# Patient Record
Sex: Male | Born: 1950 | Race: White | Hispanic: No | Marital: Married | Smoking: Former smoker
Health system: Southern US, Community
[De-identification: ages and names within clinical notes are randomized; demographics above are authoritative.]

## PROBLEM LIST (undated history)

## (undated) DIAGNOSIS — I639 Cerebral infarction, unspecified: Secondary | ICD-10-CM

## (undated) DIAGNOSIS — E785 Hyperlipidemia, unspecified: Secondary | ICD-10-CM

## (undated) HISTORY — DX: Cerebral infarction, unspecified: I63.9

## (undated) HISTORY — DX: Hyperlipidemia, unspecified: E78.5

---

## 1977-10-07 HISTORY — PX: BACK SURGERY: SHX140

## 2009-07-07 HISTORY — PX: SHOULDER SURGERY: SHX246

## 2009-07-14 ENCOUNTER — Encounter: Admission: RE | Admit: 2009-07-14 | Discharge: 2009-07-14 | Payer: Self-pay | Admitting: Orthopedic Surgery

## 2009-07-16 ENCOUNTER — Encounter: Admission: RE | Admit: 2009-07-16 | Discharge: 2009-07-16 | Payer: Self-pay | Admitting: Orthopedic Surgery

## 2009-07-26 ENCOUNTER — Ambulatory Visit (HOSPITAL_COMMUNITY): Admission: RE | Admit: 2009-07-26 | Discharge: 2009-07-27 | Payer: Self-pay | Admitting: Orthopedic Surgery

## 2009-10-27 ENCOUNTER — Encounter (INDEPENDENT_AMBULATORY_CARE_PROVIDER_SITE_OTHER): Payer: Self-pay | Admitting: Neurology

## 2009-10-27 ENCOUNTER — Inpatient Hospital Stay (HOSPITAL_COMMUNITY): Admission: EM | Admit: 2009-10-27 | Discharge: 2009-10-29 | Payer: Self-pay | Admitting: Emergency Medicine

## 2009-10-27 ENCOUNTER — Ambulatory Visit: Payer: Self-pay | Admitting: Cardiovascular Disease

## 2009-10-27 ENCOUNTER — Encounter (INDEPENDENT_AMBULATORY_CARE_PROVIDER_SITE_OTHER): Payer: Self-pay | Admitting: Emergency Medicine

## 2009-10-31 ENCOUNTER — Encounter: Admission: RE | Admit: 2009-10-31 | Discharge: 2009-11-28 | Payer: Self-pay | Admitting: Neurology

## 2009-12-25 ENCOUNTER — Encounter: Payer: Self-pay | Admitting: Internal Medicine

## 2010-01-15 ENCOUNTER — Ambulatory Visit: Payer: Self-pay

## 2010-01-16 ENCOUNTER — Encounter: Payer: Self-pay | Admitting: Internal Medicine

## 2010-01-16 ENCOUNTER — Telehealth: Payer: Self-pay | Admitting: Internal Medicine

## 2010-01-19 ENCOUNTER — Ambulatory Visit (HOSPITAL_COMMUNITY): Admission: RE | Admit: 2010-01-19 | Discharge: 2010-01-19 | Payer: Self-pay | Admitting: Internal Medicine

## 2010-01-19 ENCOUNTER — Ambulatory Visit: Payer: Self-pay | Admitting: Internal Medicine

## 2010-02-19 ENCOUNTER — Encounter: Payer: Self-pay | Admitting: Internal Medicine

## 2010-06-07 ENCOUNTER — Ambulatory Visit: Payer: Self-pay | Admitting: Cardiovascular Disease

## 2010-06-07 DIAGNOSIS — Q211 Atrial septal defect: Secondary | ICD-10-CM | POA: Insufficient documentation

## 2010-06-13 ENCOUNTER — Ambulatory Visit: Payer: Self-pay | Admitting: Cardiovascular Disease

## 2010-06-13 ENCOUNTER — Inpatient Hospital Stay (HOSPITAL_COMMUNITY): Admission: RE | Admit: 2010-06-13 | Discharge: 2010-06-14 | Payer: Self-pay | Admitting: Cardiovascular Disease

## 2010-06-13 ENCOUNTER — Ambulatory Visit: Payer: Self-pay | Admitting: Cardiology

## 2010-06-14 ENCOUNTER — Encounter: Payer: Self-pay | Admitting: Cardiovascular Disease

## 2010-07-20 ENCOUNTER — Telehealth: Payer: Self-pay | Admitting: Cardiovascular Disease

## 2010-07-27 ENCOUNTER — Encounter: Payer: Self-pay | Admitting: Cardiovascular Disease

## 2010-08-09 ENCOUNTER — Encounter: Payer: Self-pay | Admitting: Cardiovascular Disease

## 2010-08-09 ENCOUNTER — Ambulatory Visit: Payer: Self-pay

## 2010-08-09 DIAGNOSIS — I739 Peripheral vascular disease, unspecified: Secondary | ICD-10-CM | POA: Insufficient documentation

## 2010-08-24 ENCOUNTER — Encounter: Payer: Self-pay | Admitting: Cardiovascular Disease

## 2010-11-06 NOTE — Progress Notes (Signed)
Summary: TEE  Phone Note Outgoing Call   Summary of Call: Called and spoke with pt's wife regarding TEE.  TEE scheduled for January 19, 2010 at 12:15 with Dr. Tenny Craw. Instructions reviewed and wife verbalized understanding of instructions. Will fax instructions to [phone] per wife's request. Initial call taken by: Dossie Arbour, RN, BSN,  January 16, 2010 10:15 AM

## 2010-11-06 NOTE — Letter (Signed)
Summary: [facility]  [facility]   Imported By: Marylou Mccoy 09/04/2010 10:20:23  _____________________________________________________________________  External Attachment:    Type:   Image     Comment:   External Document

## 2010-11-06 NOTE — Letter (Signed)
Summary: [facility]   Imported By: Marylou Mccoy 09/07/2010 16:01:59  _____________________________________________________________________  External Attachment:    Type:   Image     Comment:   External Document

## 2010-11-06 NOTE — Assessment & Plan Note (Signed)
Summary: pfo closure   Visit Type:  Follow-up Primary Bradley Newton:  Bradley Newton, M.D.  CC:  PFO closure.  History of Present Illness: 60 year-old gentleman presents for evaluation of transcatheter PFO closure as part of the Respect PFO trial. He sustained a TIA in . 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 He has had no further neurologic symptoms and specifically denies weakness, clumsiness, tingling, numbness, visual disturbance, or aphasia.  He also denies chest pain, dyspnea, edema, orthopnea, or PND.  Current Medications (verified): 1)  Lipitor 20 Mg Tabs (Atorvastatin Calcium) .... Take One Tablet By Mouth Daily. 2)  Aspirin Ec 325 Mg Tbec (Aspirin) .... Take One Tablet By Mouth Daily 3)  Multivitamins  Tabs (Multiple Vitamin) .... Take 1 Tablet By Mouth Once A Day 4)  Garlic Oil 1000 Mg Caps (Garlic) .... Take 1 Capsule By Mouth Once A Day 5)  Fish Oil 1000 Mg Caps (Omega-3 Fatty Acids) .... Take 1 Capsule By Mouth Once A Day  Allergies (verified): 1)  ! Naprosyn  Past History:  Past medical, surgical, family and social histories (including risk factors) reviewed, and no changes noted (except as noted below).  Past Medical History: Stroke, presumably cardioembolic Hyperlipidemia  Past Surgical History: Shoulder surgery October 2010 Back surgery 1979  Family History: Reviewed history and no changes required. No family history of stroke or TIA Father - MI 64  Social History: Reviewed history and no changes required. 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 Quit smoking 1981 Etoh - one daily Exercises regularly with walking  Review of Systems       Negative except as per HPI   Vital Signs:  Patient  profile:   60 year old male Height:      73 inches Weight:      236.75 pounds BMI:     31.35 Pulse rate:   67 / minute Pulse rhythm:   regular Resp:     18 per minute BP sitting:   108 / 80  (left arm) Cuff size:   large  Vitals Entered By: Bradley Newton (June 07, 2010 4:52 PM)  Physical Exam  General:  Pt is well-developed, alert and oriented, no acute distress HEENT: normal Neck: no thyromegaly           JVP normal, carotid upstrokes normal without bruits Lungs: CTA Chest: equal expansion  CV: Apical impulse nondisplaced, RRR without murmur or gallop Abd: soft, NT, positive BS, no HSM, no bruit Back: no CVA tenderness Ext: no clubbing, cyanosis, or edema        femoral pulses 2+ without bruits        pedal pulses 2+ and equal Skin: warm, dry, no rash Neuro: CNII-XII intact,strength 5/5 = b/l    EKG  Procedure date:  06/07/2010  Findings:      NSR, within normal limits, HR 68 bpm.  Echocardiogram  Procedure date:  01/19/2010  Findings:      Study Conclusions Atrial septum: Interatrial septum is aneurysmal and bulges towards RA. there is a PFO/small ASD near inferior poriton of the secundum area of the septum. Present by color doppler with L to R flow. Also posited as tested with injection of agitated saline with bubbles sen in L  sided chambers.    Transesophageal echocardiography. 2D and color Doppler. Patient status: Outpatient. Location: Endoscopy.  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 This defect looks appropriate for transcatheter closure via the Respect Trial protocol. I have reviewed risks, indications, and alternatives to PFO closure with the patient and his wife. Risks include bleeding, infection, device embolization, stroke, MI, cardiac perforation, tamponade, and death. Risks of major complications is less than 1%. They understand  and agree to proceed. The patient currently takes a daily ASA and will begin plavix 75 mg daily prior to the procedure.  Orders: EKG w/ Interpretation (93000)  Patient Instructions: 1)  You are scheduled for a PFO Closure.  Please see instruction sheet.  2)  Your physician has recommended you make the following change in your medication: START Plavix 300mg  (4 tablets) on Sunday 06/10/10, then decrease Plavix 75mg  once a day

## 2010-11-06 NOTE — Procedures (Signed)
Summary: Summary Report  Summary Report   Imported By: Erle Crocker 03/23/2010 10:57:00  _____________________________________________________________________  External Attachment:    Type:   Image     Comment:   External Document

## 2010-11-06 NOTE — Miscellaneous (Signed)
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 

## 2010-11-06 NOTE — Letter (Signed)
Summary: TEE Instructions  [facility], Main Office  [address]   [place], [place] [address]   Phone: [phone]  Fax: [phone]      TEE Instructions    You are scheduled for a TEE on  January 19, 2010 with Dr. Tenny Craw.  Please arrive at the [facility] at _11:15__ a.m.Marland Kitchen on the day of your procedure.  1)   Diet:         May have clear liquid breakfast.  Clear liquids include:  water, broth,        Sprite, Ginger Ale, black cofee, tea (no sugar),apple juice, jello (not red), popsicle from clear juices (not red).  2)  Must have a responsible person to drive you home.  3)   Bring your current insurance cards and current list of all your medications.   *Special Note:  Every effort is made to have your procedure done on time.  Occasionally there are emergencies that present themselves at the hospital that may cause delays.  Please be patient if a delay does occur.  *If you have any questions after you get home, please call the office at [phone].    Appended Document: TEE Instructions Added nothing to drink after 7 AM to instruction sheet faxed to pt. Instructions faxed to [phone]

## 2010-11-06 NOTE — Progress Notes (Signed)
Summary: Question about having foot surgery  Phone Note Call from Patient Call back at Home Phone [phone]   Caller: Patient Summary of Call: Pt have question about foot surgery. 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  This is not scheduled at this time but he would like to get this done in November.  The pt said Dr Excell Seltzer had spoken with him about SBE with dental procedures but was unsure if he needs antibiotics prior to foot procedure.  I told the pt that I do not think he needs antibiotics but will clarify this with Dr Excell Seltzer and call him back.  Follow-up by: Julieta Gutting, RN, BSN,  July 20, 2010 12:42 PM  Additional Follow-up for Phone Call Additional follow up Details #1::        Pt doesn't need SBE prophylaxis for foot surgery. He may want to wait until December to have surgery so that he can complete 3 months of plavix before holding it for the procedure. Additional Follow-up by: Norva Karvonen, MD,  July 22, 2010 7:52 PM    Additional Follow-up for Phone Call Additional follow up Details #2::    I spoke with the pt's wife and made her aware that the pt does not need SBE for foot procedure.  I told her that Dr Excell Seltzer does not want the pt to hold Plavix prior to 3 months from closure.  They will contact our office if the pt does need to hold plavix prior to procedure.   Follow-up by: Julieta Gutting, RN, BSN,  July 23, 2010 11:37 AM

## 2010-11-06 NOTE — Letter (Signed)
Summary: PFO Closure  [facility], Main Office  [address]   [place], [place] [address]   Phone: [phone]  Fax: [phone]     06/07/2010 MRN: [id number]  PARMVIR BOOMER [address] [place], [place]  [address]  Dear Mr. KESSNER,   You are scheduled for PFO Closure on Wednesday June 13, 2010              with Dr. Excell Seltzer.  Please arrive at the [facility] at 8:00       a.m. on the day of your procedure.  1. DIET     __X__ Nothing to eat or drink after midnight except your medications with a sip of water.  PLEASE DRINK PLENTY OF FLUIDS THE DAY BEFORE PROCEDURE.  2. MAKE SURE YOU TAKE YOUR ASPIRIN AND PLAVIX.  3. __X__ YOU MAY TAKE ALL of your remaining medications with a small amount of water.          4. Plan for one night stay - bring personal belongings (i.e. toothpaste, toothbrush, etc.)  5. Bring a current list of your medications and current insurance cards.  6. Must have a responsible person to drive you home.   7. Someone must be with you for the first 24 hours after you arrive home.  8. Please wear clothes that are easy to get on and off and wear slip-on shoes.  *Special note: Every effort is made to have your procedure done on time.  Occasionally there are emergencies that present themselves at the hospital that may cause delays.  Please be patient if a delay does occur.  If you have any questions after you get home, please call the office at the number listed above.  Julieta Gutting, RN, BSN

## 2010-11-06 NOTE — Letter (Signed)
Summary: [facility] Office Visit Note   [facility] Office Visit Note   Imported By: Roderic Ovens 03/29/2010 09:51:54  _____________________________________________________________________  External Attachment:    Type:   Image     Comment:   External Document

## 2010-11-13 ENCOUNTER — Ambulatory Visit (HOSPITAL_COMMUNITY)
Admission: RE | Admit: 2010-11-13 | Discharge: 2010-11-13 | Disposition: A | Discharge status: Home / Self Care | Payer: 59 | Source: Ambulatory Visit | Attending: Cardiology | Admitting: Cardiology

## 2010-11-13 DIAGNOSIS — Z09 Encounter for follow-up examination after completed treatment for conditions other than malignant neoplasm: Secondary | ICD-10-CM | POA: Insufficient documentation

## 2010-11-29 ENCOUNTER — Telehealth: Payer: Self-pay | Admitting: Cardiovascular Disease

## 2010-12-04 NOTE — Progress Notes (Signed)
Summary: Plavix question  Phone Note Call from Patient Call back at Work Phone [phone]   Caller: Patient Reason for Call: Talk to Nurse, Talk to Doctor Summary of Call: pt was wondering if he could come off plavix now Initial call taken by: Omer Jack,  November 29, 2010 11:22 AM  Follow-up for Phone Call        This pt had ASD closure performed on 06/14/10.  Per procedure note Plavix was recommended for 6 months. 12/13/10 will be 6 months from procedure. The pt should continue ASA. The pt has a current supply of Plavix so I made him aware he could officially stop plavix after 12/13/10.  Pt agreed with plan. Follow-up by: Julieta Gutting, RN, BSN,  November 29, 2010 12:17 PM

## 2010-12-20 LAB — CBC
HCT: 44.3 % (ref 39.0–52.0)
Hemoglobin: 14.1 g/dL (ref 13.0–17.0)
Hemoglobin: 15.3 g/dL (ref 13.0–17.0)
MCH: 31.7 pg (ref 26.0–34.0)
MCHC: 34.5 g/dL (ref 30.0–36.0)
MCV: 91.7 fL (ref 78.0–100.0)
Platelets: 198 10*3/uL (ref 150–400)
Platelets: 245 K/uL (ref 150–400)
RBC: 4.83 MIL/uL (ref 4.22–5.81)
RDW: 12.4 % (ref 11.5–15.5)
WBC: 4.7 K/uL (ref 4.0–10.5)

## 2010-12-20 LAB — PROTIME-INR
INR: 0.94 (ref 0.00–1.49)
Prothrombin Time: 12.8 s (ref 11.6–15.2)

## 2010-12-20 LAB — BASIC METABOLIC PANEL WITH GFR
BUN: 10 mg/dL (ref 6–23)
CO2: 28 meq/L (ref 19–32)
Calcium: 8.7 mg/dL (ref 8.4–10.5)
Chloride: 108 meq/L (ref 96–112)
Creatinine, Ser: 0.99 mg/dL (ref 0.4–1.5)
GFR calc non Af Amer: >60 mL/min
Glucose, Bld: 98 mg/dL (ref 70–99)
Potassium: 4 meq/L (ref 3.5–5.1)
Sodium: 140 meq/L (ref 135–145)

## 2010-12-20 LAB — POCT I-STAT 3, VENOUS BLOOD GAS (G3P V)
Bicarbonate: 25.3 mEq/L — ABNORMAL HIGH (ref 20.0–24.0)
TCO2: 27 mmol/L (ref 0–100)
pCO2, Ven: 46.3 mmHg (ref 45.0–50.0)
pH, Ven: 7.345 — ABNORMAL HIGH (ref 7.250–7.300)

## 2010-12-20 LAB — BASIC METABOLIC PANEL
Calcium: 9.5 mg/dL (ref 8.4–10.5)
GFR calc non Af Amer: >60 mL/min (ref >60)
Sodium: 139 mEq/L (ref 135–145)

## 2010-12-24 LAB — COMPREHENSIVE METABOLIC PANEL
ALT: 37 U/L (ref 0–53)
AST: 25 U/L (ref 0–37)
Alkaline Phosphatase: 41 U/L (ref 39–117)
BUN: 13 mg/dL (ref 6–23)
Creatinine, Ser: 0.9 mg/dL (ref 0.4–1.5)
GFR calc non Af Amer: >60 mL/min (ref >60)
Potassium: 4.5 mEq/L (ref 3.5–5.1)
Sodium: 141 mEq/L (ref 135–145)
Total Bilirubin: 0.6 mg/dL (ref 0.3–1.2)

## 2010-12-24 LAB — LIPID PANEL
HDL: 37 mg/dL — ABNORMAL LOW (ref >39)
Total CHOL/HDL Ratio: 5.5 RATIO
VLDL: 34 mg/dL (ref 0–40)

## 2010-12-24 LAB — CBC
MCHC: 34.7 g/dL (ref 30.0–36.0)
Platelets: 223 10*3/uL (ref 150–400)
RDW: 13.4 % (ref 11.5–15.5)

## 2010-12-24 LAB — CK TOTAL AND CKMB (NOT AT ARMC)
Relative Index: 2.7 — ABNORMAL HIGH (ref 0.0–2.5)
Total CK: 119 U/L (ref 7–232)
Total CK: 120 U/L (ref 7–232)

## 2010-12-24 LAB — TROPONIN I: Troponin I: 0.08 ng/mL — ABNORMAL HIGH (ref 0.00–0.06)

## 2010-12-24 LAB — GLUCOSE, CAPILLARY
Glucose-Capillary: 109 mg/dL — ABNORMAL HIGH (ref 70–99)
Glucose-Capillary: 115 mg/dL — ABNORMAL HIGH (ref 70–99)
Glucose-Capillary: 151 mg/dL — ABNORMAL HIGH (ref 70–99)

## 2010-12-24 LAB — DIFFERENTIAL
Basophils Absolute: 0 10*3/uL (ref 0.0–0.1)
Eosinophils Relative: 4 % (ref 0–5)
Lymphocytes Relative: 20 % (ref 12–46)
Lymphs Abs: 1.1 10*3/uL (ref 0.7–4.0)
Monocytes Absolute: 0.4 10*3/uL (ref 0.1–1.0)
Monocytes Relative: 7 % (ref 3–12)

## 2010-12-24 LAB — PROTIME-INR: Prothrombin Time: 12.8 seconds (ref 11.6–15.2)

## 2010-12-24 LAB — APTT: aPTT: 25 seconds (ref 24–37)

## 2011-01-10 LAB — CBC
HCT: 45.4 % (ref 39.0–52.0)
Platelets: 228 10*3/uL (ref 150–400)
WBC: 5.2 10*3/uL (ref 4.0–10.5)

## 2011-01-10 LAB — COMPREHENSIVE METABOLIC PANEL
AST: 27 U/L (ref 0–37)
Albumin: 4.6 g/dL (ref 3.5–5.2)
Alkaline Phosphatase: 39 U/L (ref 39–117)
BUN: 8 mg/dL (ref 6–23)
Chloride: 104 mEq/L (ref 96–112)
GFR calc Af Amer: >60 mL/min (ref >60)
Potassium: 4.4 mEq/L (ref 3.5–5.1)
Total Bilirubin: 0.7 mg/dL (ref 0.3–1.2)

## 2011-01-10 LAB — URINALYSIS, ROUTINE W REFLEX MICROSCOPIC
Glucose, UA: NEGATIVE mg/dL
Protein, ur: NEGATIVE mg/dL
Specific Gravity, Urine: 1.003 — ABNORMAL LOW (ref 1.005–1.030)
pH: 7 (ref 5.0–8.0)

## 2011-01-10 LAB — DIFFERENTIAL
Basophils Absolute: 0 10*3/uL (ref 0.0–0.1)
Basophils Relative: 1 % (ref 0–1)
Eosinophils Relative: 3 % (ref 0–5)
Monocytes Absolute: 0.4 10*3/uL (ref 0.1–1.0)

## 2011-02-19 NOTE — Letter (Signed)
August 07, 2010    Richard C. Leeanne Deed, DPM  [facility]  [address]  [place], [place]  [address]   RE:  [name], [name]  MRN:  [id number]  /  DOB:  1951/08/26   Dear Dr. Leeanne Deed:   I received your letter regarding Bradley Newton.  I understand that he  is requesting bunion surgery.  He underwent implantation of an atrial  septal occluder device on September 8.  He should continue uninterrupted  antiplatelet therapy with aspirin and Plavix for 3 months.  After 3  months, it would be reasonable to withhold his Plavix for surgery.  Prophylaxis for bacterial endocarditis is also required for a period of  6 months, but with a sterile surgical procedure, this should not be  necessary.   Otherwise, he has been stable from a cardiovascular standpoint and  should be at low risk of complications from bunion surgery.  If he can  wait until after December 8, that would be a full 3 months of  uninterrupted Plavix, which would probably be best from a cardiac  standpoint.   Thanks and please call at any time with questions.    Sincerely,      Veverly Fells. Excell Seltzer, MD  Electronically Signed    MDC/MedQ  DD: 08/07/2010  DT: 08/07/2010  Job #: [id number]

## 2011-04-11 ENCOUNTER — Telehealth: Payer: Self-pay | Admitting: *Deleted

## 2011-04-11 MED ORDER — METOPROLOL TARTRATE 25 MG PO TABS: 25.0000 mg | ORAL_TABLET | Freq: Two times a day (BID) | ORAL | Status: AC

## 2011-04-11 NOTE — Telephone Encounter (Signed)
Dr. Johney Frame (DOD) spoke with Dr. Red Christians earlier today about the patient. He is s/p bunionectomy with new onset post op a-fib. Per their discussion, Dr. Johney Frame advised the patient to start metoprolol tart 25mg  one tablet bid. We will send this prescription in to [facility] on [address] Dr. Johney Frame would like the triage nurse to contact the patient tomorrow and see how he is doing. Per Dr. Johney Frame, the patient should come in for an EKG on Friday to see what his rate is, then f/u with Dr. Excell Seltzer when he returns to the office, or see Azucena Kuba on a day Dr. Excell Seltzer will be in the office. The EKG should be reviewed with the DOD on Friday. I have attempted to contact the patient this evening to let him know his prescription has been called in and to set up a time for him to come for an EKG on 7/6. There is no answer and no machine at his home #. I will forward to the triage.

## 2011-04-12 ENCOUNTER — Telehealth: Payer: Self-pay | Admitting: *Deleted

## 2011-04-12 NOTE — Telephone Encounter (Signed)
Pt is post surgical bunionectomy and post PFO closure with dr cooper--after procedure in recovery, pt developed a fib--was consiquently referred to dr allred (DOD) who wanted pt to come in for EKG--heather mcgee attempted to get pt to have him pic up RX for metoprolol tart 25mg  BID AND come in for EKG --she was unable to reach--i spoke to pt this am who states his BP this am was 110/70 with pulse regular--he states he cannot come in for EKG until wed 7/11, as he is in pain from foot surgery, but has an appoint with dr Leeanne Deed on wed and will stop by after for EKG--in meantime family will get metoprolol from pharmacy and he will start that--appoint in computer for EKG--if pt develops any heart irregularities he will go to ED--NT

## 2011-04-17 ENCOUNTER — Telehealth: Payer: Self-pay | Admitting: *Deleted

## 2011-04-17 ENCOUNTER — Encounter (INDEPENDENT_AMBULATORY_CARE_PROVIDER_SITE_OTHER): Payer: BC Managed Care – PPO

## 2011-04-17 DIAGNOSIS — I4891 Unspecified atrial fibrillation: Secondary | ICD-10-CM

## 2011-04-17 NOTE — Telephone Encounter (Signed)
Pt here for EKG, post hosp after developing a fib during bunionectomy--pt EKG appears to show NSR at this time--reviewed with dr allred(DOD) who suggested pt f/u with dr cooper for 1 more visit(pt had been released by dr cooper)--pt agrees--appoint made--nt

## 2011-05-28 ENCOUNTER — Encounter: Payer: Self-pay | Admitting: Internal Medicine

## 2011-06-19 ENCOUNTER — Encounter: Payer: Self-pay | Admitting: Cardiovascular Disease

## 2011-06-19 ENCOUNTER — Other Ambulatory Visit: Payer: Self-pay | Admitting: *Deleted

## 2011-06-20 ENCOUNTER — Ambulatory Visit: Payer: BC Managed Care – PPO | Admitting: Cardiovascular Disease

## 2011-06-20 ENCOUNTER — Ambulatory Visit (INDEPENDENT_AMBULATORY_CARE_PROVIDER_SITE_OTHER): Payer: BC Managed Care – PPO | Admitting: Cardiovascular Disease

## 2011-06-20 ENCOUNTER — Encounter: Payer: Self-pay | Admitting: Cardiovascular Disease

## 2011-06-20 VITALS — BP 130/72 | HR 44 | Ht 74.0 in | Wt 264.8 lb

## 2011-06-20 DIAGNOSIS — Q211 Atrial septal defect: Secondary | ICD-10-CM

## 2011-06-20 NOTE — Patient Instructions (Signed)

## 2011-06-28 ENCOUNTER — Encounter: Payer: Self-pay | Admitting: Cardiovascular Disease

## 2011-06-28 ENCOUNTER — Ambulatory Visit (HOSPITAL_COMMUNITY): Payer: BC Managed Care – PPO | Attending: Cardiovascular Disease | Admitting: Radiology

## 2011-06-28 DIAGNOSIS — I059 Rheumatic mitral valve disease, unspecified: Secondary | ICD-10-CM | POA: Insufficient documentation

## 2011-06-28 DIAGNOSIS — Q2111 Secundum atrial septal defect: Secondary | ICD-10-CM | POA: Insufficient documentation

## 2011-06-28 DIAGNOSIS — I079 Rheumatic tricuspid valve disease, unspecified: Secondary | ICD-10-CM | POA: Insufficient documentation

## 2011-06-28 DIAGNOSIS — Q211 Atrial septal defect: Secondary | ICD-10-CM

## 2011-06-28 NOTE — Progress Notes (Signed)
HPI:  This is a 60 year old gentleman presenting for followup evaluation. The patient had a cryptogenic stroke in 2011 and was found to have an interatrial septal communication by TEE. He was randomized to device closure via the RESPECT trial protocol. He ultimately was treated with a 16 mm atrial septal occluder device. The patient presents today for followup evaluation.  He has done well without palpitations, chest pain, dyspnea, or edema. He has had no recurrent stroke or TIA symptoms. He has not been engaged in regular exercise. He has no other complaints today.  Outpatient Encounter Prescriptions as of 06/20/2011  Medication Sig Dispense Refill  . allopurinol (ZYLOPRIM) 100 MG tablet Take 1 tablet by mouth Daily.      . ANDROGEL PUMP 20.25 MG/ACT (1.62%) GEL Place 2 application onto the skin Daily.      Marland Kitchen aspirin 325 MG tablet Take 325 mg by mouth daily.        Marland Kitchen atorvastatin (LIPITOR) 20 MG tablet Take 1 tablet by mouth Daily.      . clonazePAM (KLONOPIN) 1 MG tablet Take 1 tablet by mouth as needed.      . COLCRYS 0.6 MG tablet Take 1 tablet by mouth Daily.      . fish oil-omega-3 fatty acids 1000 MG capsule Take 1 g by mouth daily.        . metoprolol tartrate (LOPRESSOR) 25 MG tablet Take 1 tablet (25 mg total) by mouth 2 (two) times daily.  60 tablet  6  . multivitamin (THERAGRAN) per tablet Take 1 tablet by mouth daily.          Allergies  Allergen Reactions  . Naproxen     Past Medical History  Diagnosis Date  . Stroke   . HLD (hyperlipidemia)     ROS: Negative except as per HPI  BP 130/72  Pulse 44  Ht 6\' 2"  (1.88 m)  Wt 264 lb 12.8 oz (120.112 kg)  BMI 34.00 kg/m2  PHYSICAL EXAM: Pt is alert and oriented, NAD HEENT: normal Neck: JVP - normal, carotids 2+= without bruits Lungs: CTA bilaterally CV: RRR without murmur or gallop Abd: soft, NT, Positive BS, no hepatomegaly Ext: no C/C/E, distal pulses intact and equal Skin: warm/dry no rash  ASSESSMENT AND  PLAN:

## 2011-06-28 NOTE — Assessment & Plan Note (Signed)
The patient is stable without complaints at present. He did have palpitations several months ago there was concern about paroxysmal atrial fibrillation but this has not been documented. We'll continue a period of observation. He is due for a followup echocardiogram as he is now 12 months out from atrial septal closure. He should continue on antiplatelet therapy with aspirin.  Of note, the patient is scheduled for upcoming bunionectomy. He is at low risk of cardiovascular complications and can proceed with surgery without further testing.

## 2012-03-12 IMAGING — CR DG CHEST 2V
3 series · 3 of 3 positions shown · non-contrast
Comparison: None.

CLINICAL DATA: Preop.

CHEST - 2 VIEW

[view not recorded (1 of 3)]
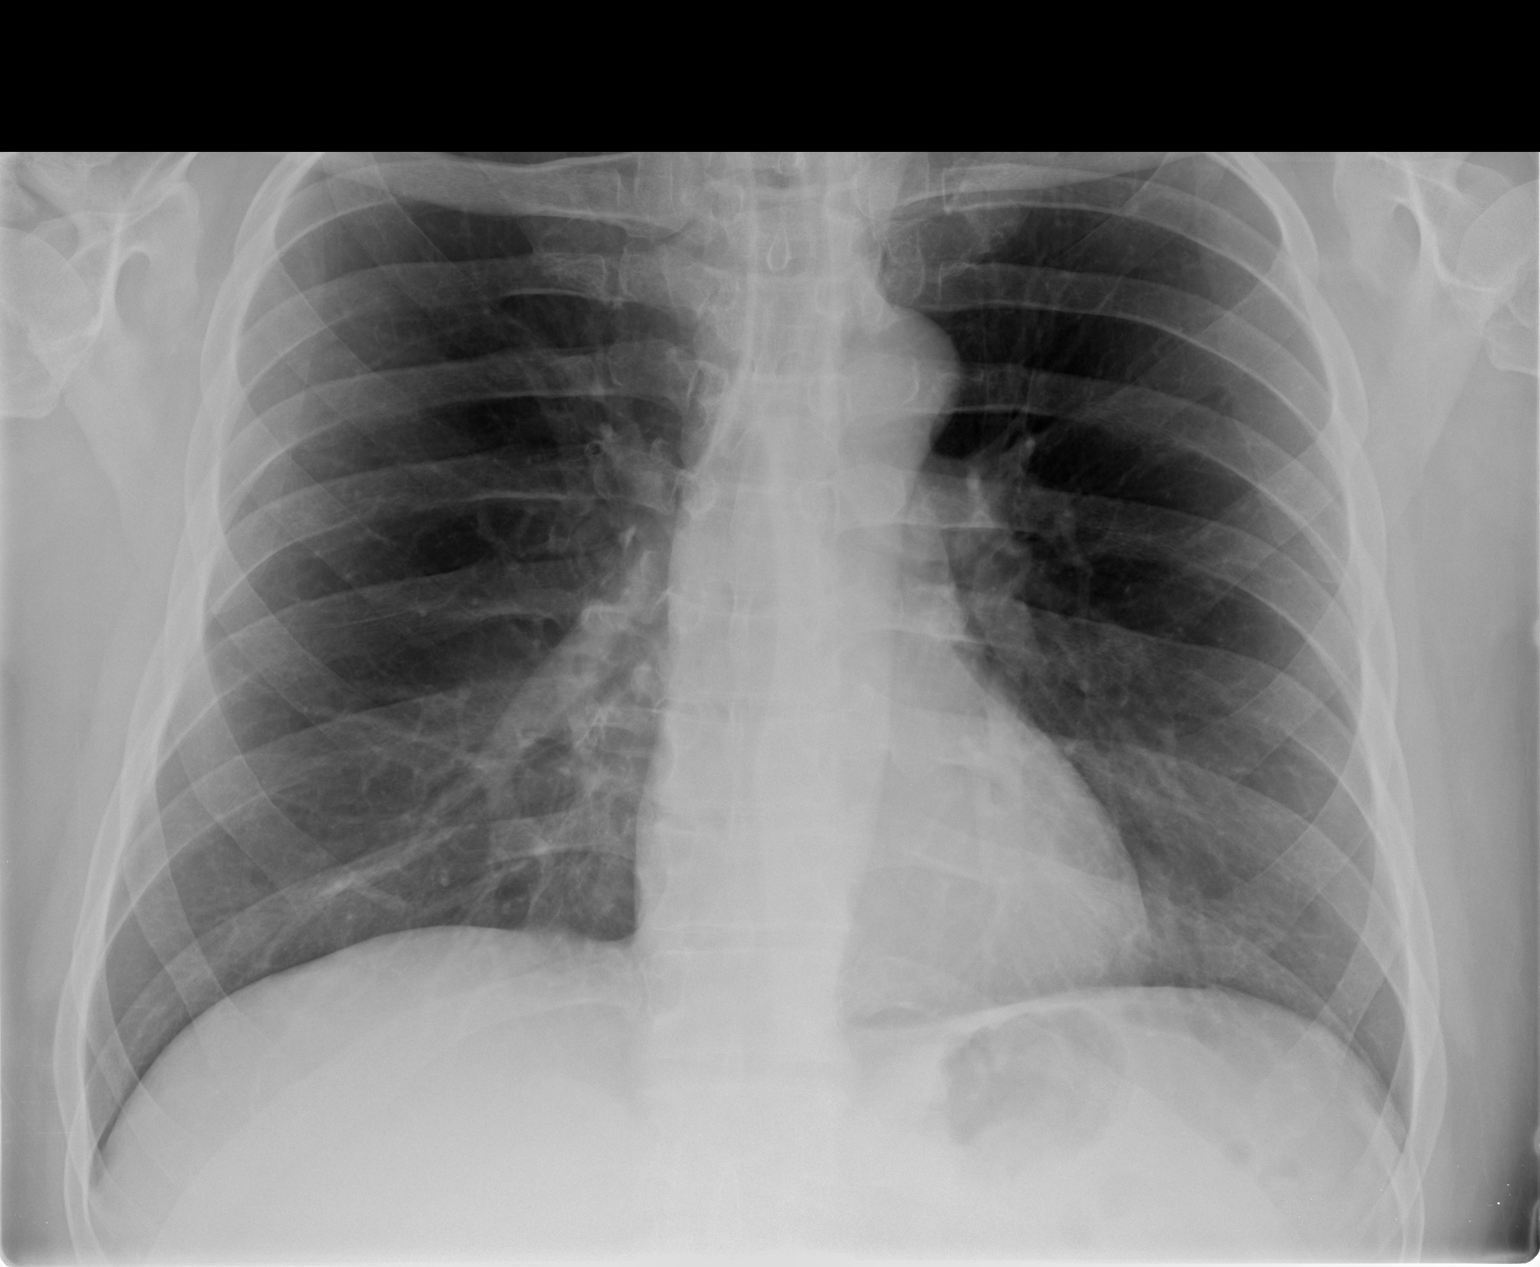

[view not recorded (2 of 3)]
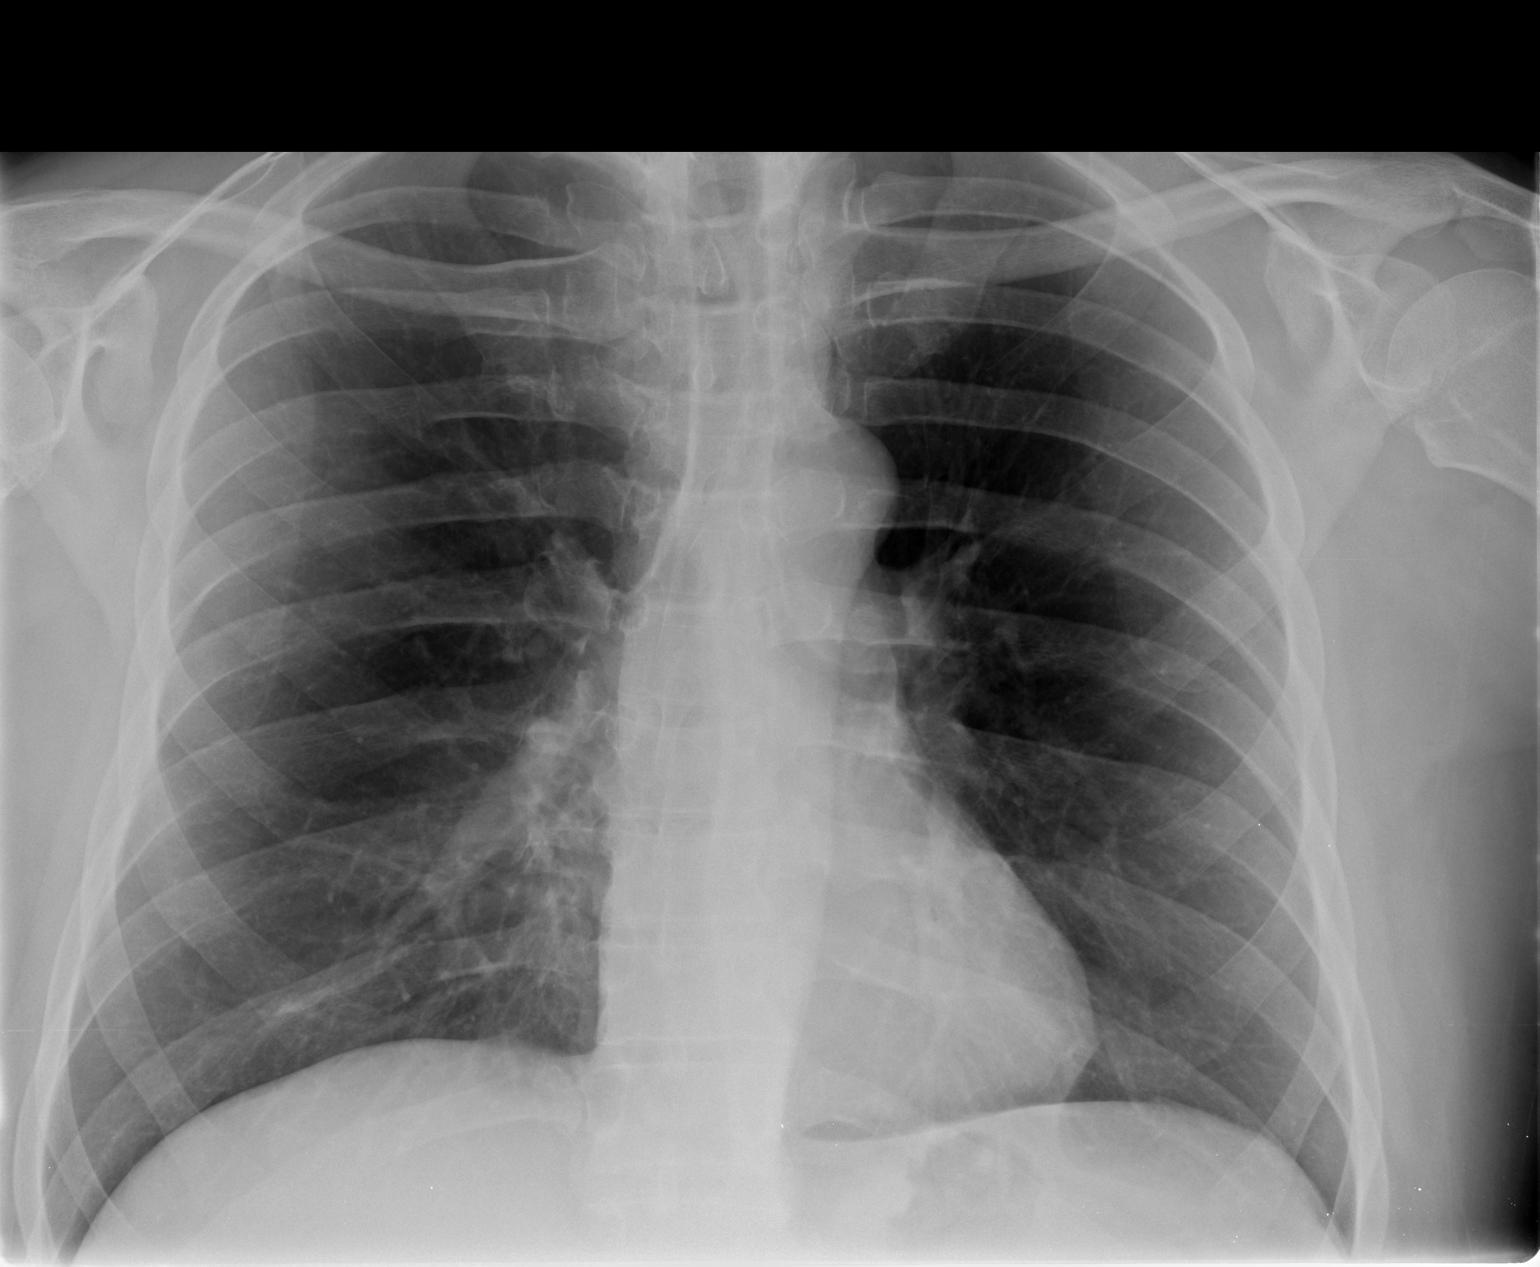

[view not recorded (3 of 3)]
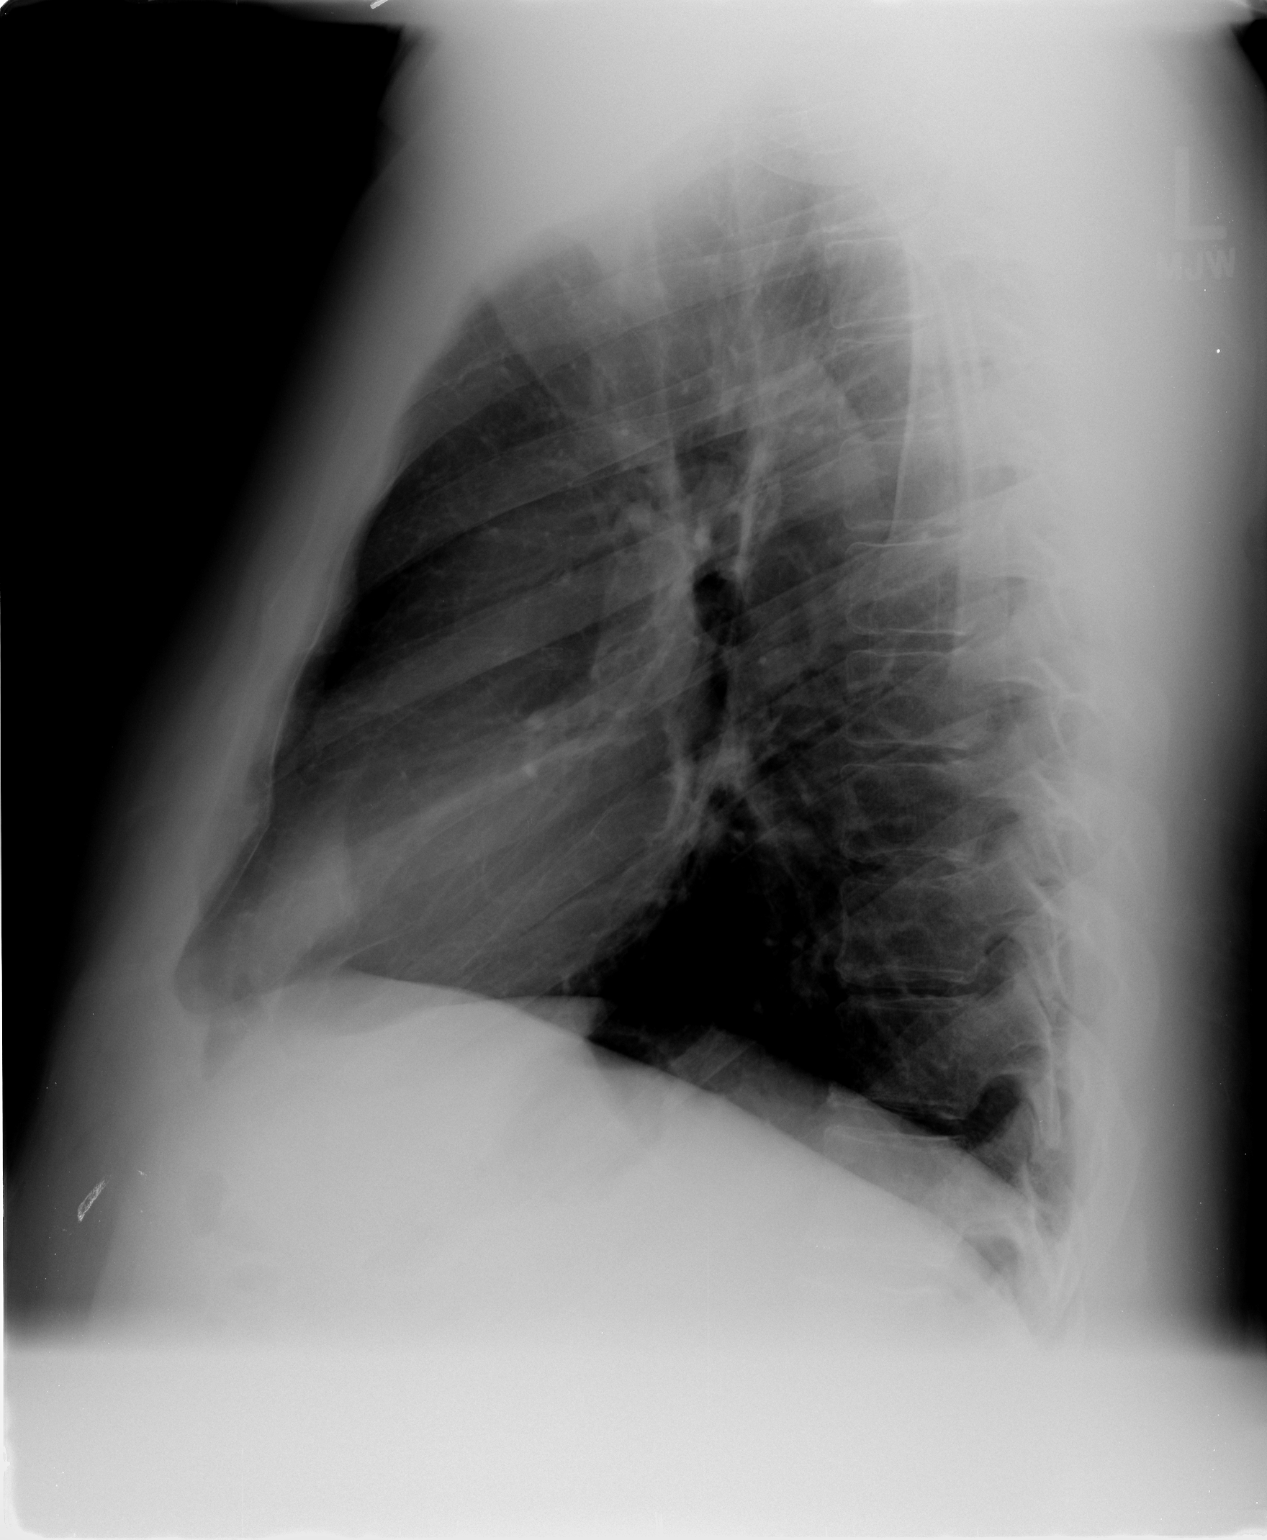

[3 of 3 positions shown; findings below may reference images not displayed]

FINDINGS: Trachea is midline.  Heart size normal.  Lungs are clear.
No pleural fluid.
IMPRESSION: No acute findings.

## 2012-03-13 IMAGING — CR DG CHEST 2V
2 series · 2 of 2 positions shown · non-contrast
Comparison: Chest x-ray of 06/13/2010

CLINICAL DATA: Post cardiac catheterization, history of septal
defect

CHEST - 2 VIEW

[w chest pa]
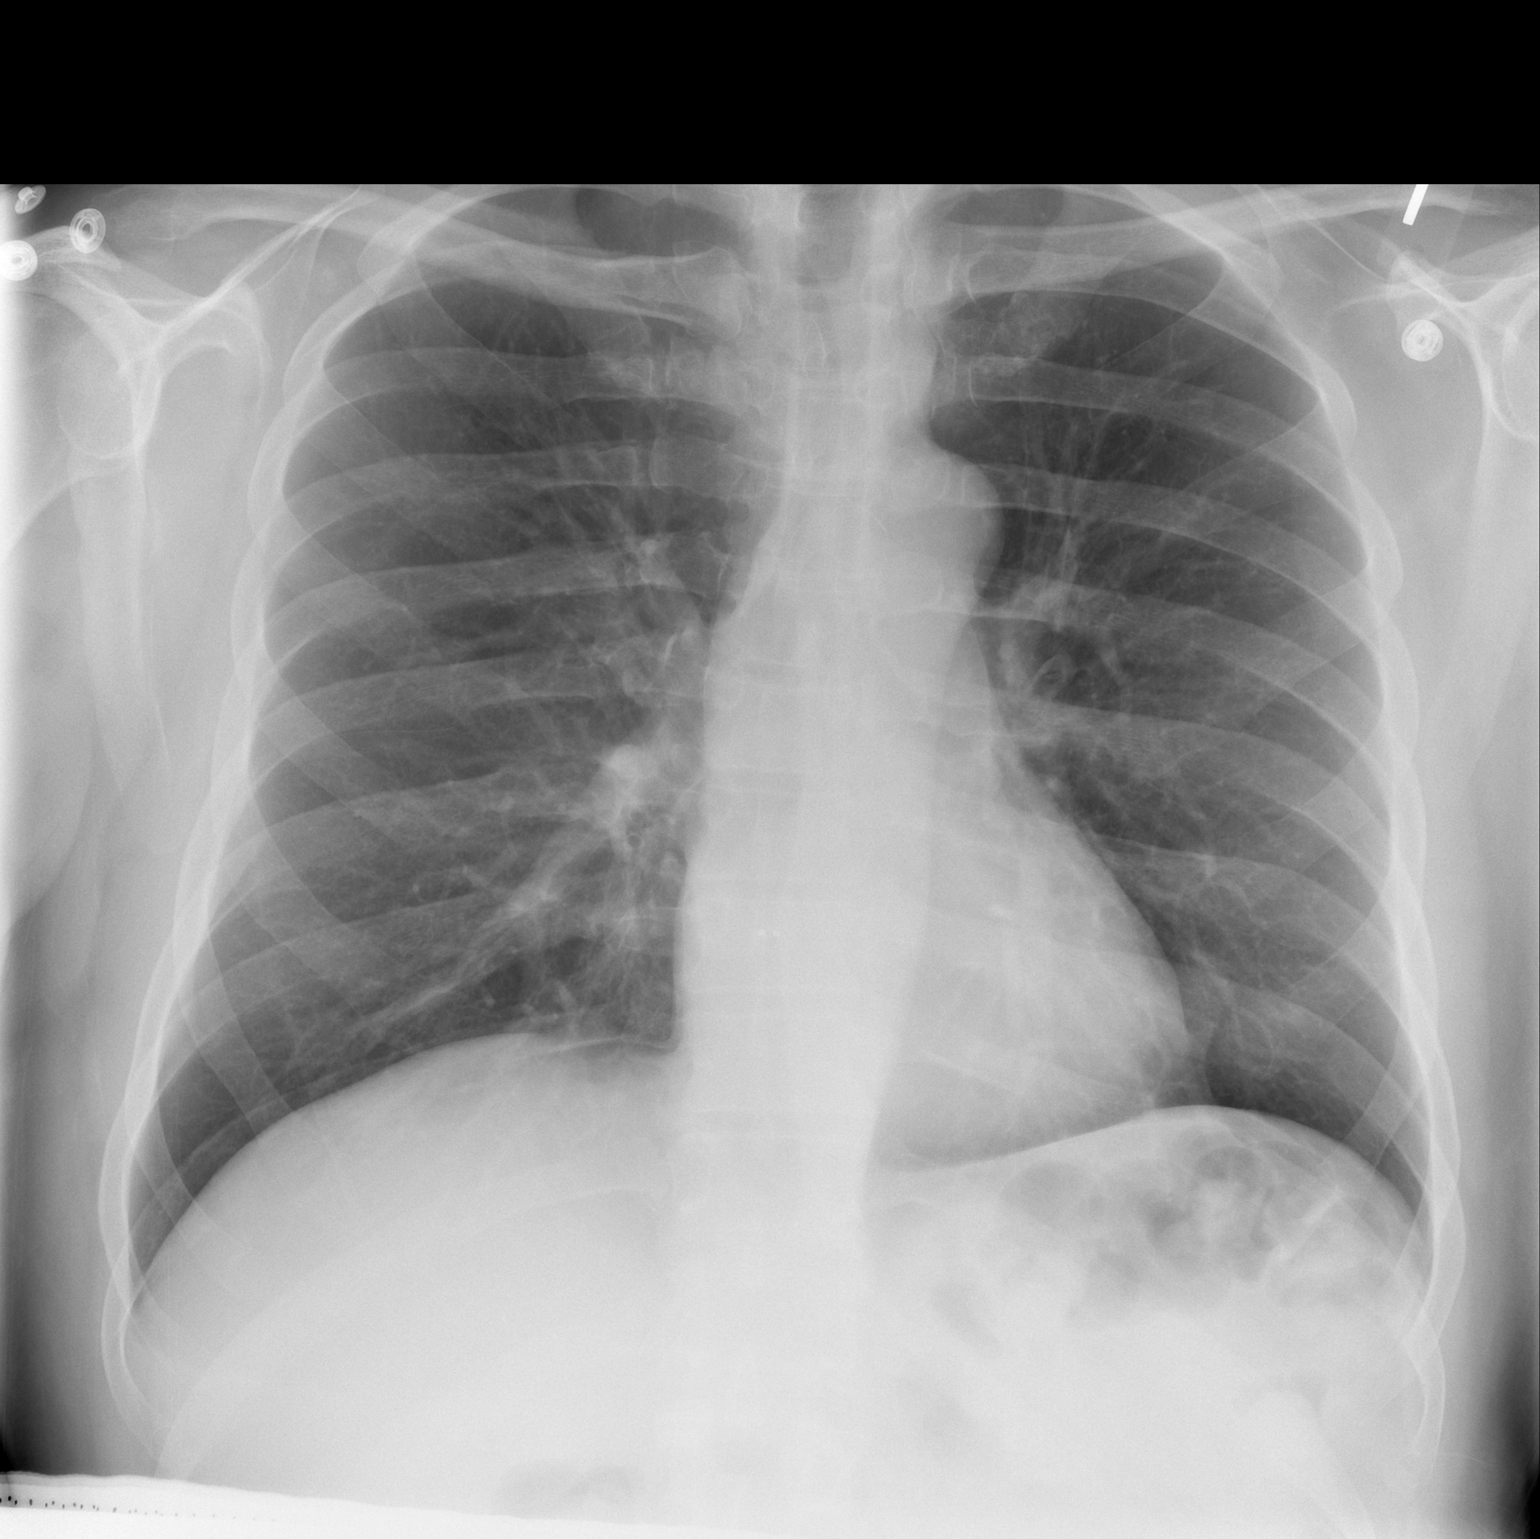

[w chest lat]
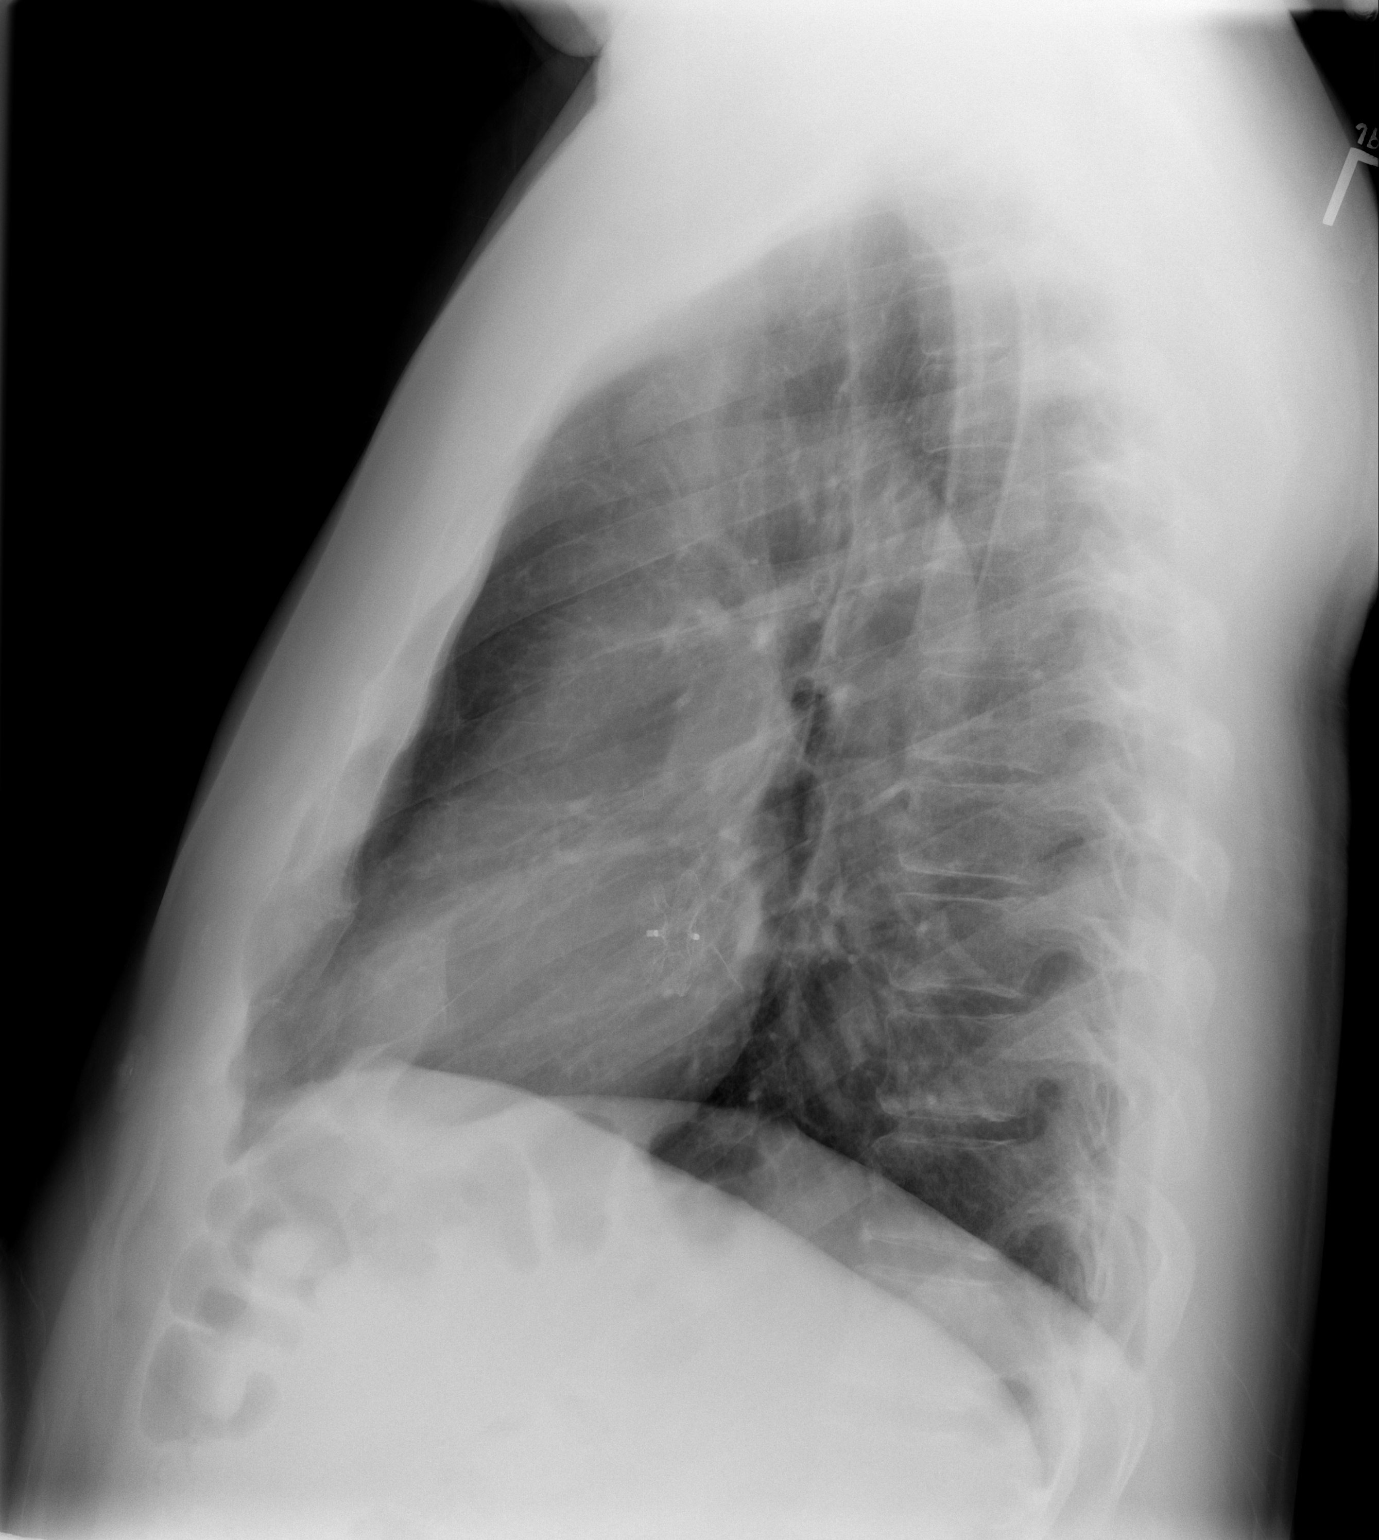

[2 of 2 positions shown; findings below may reference images not displayed]

FINDINGS: The lungs are clear.  Mediastinal contours are stable.
The heart is within normal limits in size.  Device for repair of
septal defect is noted.  No bony abnormality is seen.
IMPRESSION: No active lung disease.  Device for repair of septal defect is
noted.

## 2013-05-13 ENCOUNTER — Telehealth: Payer: Self-pay | Admitting: Neurology

## 2013-05-13 NOTE — Telephone Encounter (Signed)
Contacted patient for his 3 year follow-up phone call in RESPECT. All required questions was answered. Patient stated that he has not had any symptoms of stroke or TIA since last contact. There was no AE's or SAE's to report at this visit. Patient was informed to contact our office if he have any question or concerns. Patient verbalized that he understood.

## 2014-07-19 ENCOUNTER — Telehealth: Payer: Self-pay | Admitting: Neurology

## 2014-07-19 NOTE — Telephone Encounter (Signed)
Left pt. A message to return my call regarding the study trial.

## 2014-07-20 NOTE — Telephone Encounter (Signed)
Patient returned call, and I conducted the Patient Satisfaction Survey for the Respect Trial

## 2015-04-21 ENCOUNTER — Telehealth: Payer: Self-pay

## 2015-04-21 NOTE — Telephone Encounter (Signed)
Called and left VM . Regarding research study. Bradley Newton

## 2017-07-09 DIAGNOSIS — H2513 Age-related nuclear cataract, bilateral: Secondary | ICD-10-CM | POA: Diagnosis not present

## 2017-07-09 DIAGNOSIS — H25012 Cortical age-related cataract, left eye: Secondary | ICD-10-CM | POA: Diagnosis not present

## 2017-07-09 DIAGNOSIS — H25013 Cortical age-related cataract, bilateral: Secondary | ICD-10-CM | POA: Diagnosis not present

## 2017-07-09 DIAGNOSIS — H2512 Age-related nuclear cataract, left eye: Secondary | ICD-10-CM | POA: Diagnosis not present

## 2017-07-09 DIAGNOSIS — H25043 Posterior subcapsular polar age-related cataract, bilateral: Secondary | ICD-10-CM | POA: Diagnosis not present

## 2017-08-12 DIAGNOSIS — H2512 Age-related nuclear cataract, left eye: Secondary | ICD-10-CM | POA: Diagnosis not present

## 2017-08-19 DIAGNOSIS — H25811 Combined forms of age-related cataract, right eye: Secondary | ICD-10-CM | POA: Diagnosis not present

## 2017-08-19 DIAGNOSIS — H2511 Age-related nuclear cataract, right eye: Secondary | ICD-10-CM | POA: Diagnosis not present

## 2018-01-21 DIAGNOSIS — H26491 Other secondary cataract, right eye: Secondary | ICD-10-CM | POA: Diagnosis not present

## 2018-01-21 DIAGNOSIS — H43813 Vitreous degeneration, bilateral: Secondary | ICD-10-CM | POA: Diagnosis not present

## 2018-01-21 DIAGNOSIS — H26493 Other secondary cataract, bilateral: Secondary | ICD-10-CM | POA: Diagnosis not present

## 2018-01-21 DIAGNOSIS — H43393 Other vitreous opacities, bilateral: Secondary | ICD-10-CM | POA: Diagnosis not present

## 2018-01-28 DIAGNOSIS — Z961 Presence of intraocular lens: Secondary | ICD-10-CM | POA: Diagnosis not present

## 2018-02-16 DIAGNOSIS — H26492 Other secondary cataract, left eye: Secondary | ICD-10-CM | POA: Diagnosis not present

## 2018-02-16 DIAGNOSIS — Z961 Presence of intraocular lens: Secondary | ICD-10-CM | POA: Diagnosis not present

## 2018-02-25 DIAGNOSIS — Z961 Presence of intraocular lens: Secondary | ICD-10-CM | POA: Diagnosis not present

## 2018-02-27 DIAGNOSIS — Z1159 Encounter for screening for other viral diseases: Secondary | ICD-10-CM | POA: Diagnosis not present

## 2018-02-27 DIAGNOSIS — Z125 Encounter for screening for malignant neoplasm of prostate: Secondary | ICD-10-CM | POA: Diagnosis not present

## 2018-02-27 DIAGNOSIS — E7800 Pure hypercholesterolemia, unspecified: Secondary | ICD-10-CM | POA: Diagnosis not present

## 2018-02-27 DIAGNOSIS — E291 Testicular hypofunction: Secondary | ICD-10-CM | POA: Diagnosis not present

## 2018-02-27 DIAGNOSIS — Z0000 Encounter for general adult medical examination without abnormal findings: Secondary | ICD-10-CM | POA: Diagnosis not present

## 2018-02-27 DIAGNOSIS — M2042 Other hammer toe(s) (acquired), left foot: Secondary | ICD-10-CM | POA: Diagnosis not present

## 2018-02-27 DIAGNOSIS — G4730 Sleep apnea, unspecified: Secondary | ICD-10-CM | POA: Diagnosis not present

## 2018-04-29 DIAGNOSIS — M2042 Other hammer toe(s) (acquired), left foot: Secondary | ICD-10-CM | POA: Diagnosis not present

## 2018-04-29 DIAGNOSIS — M79672 Pain in left foot: Secondary | ICD-10-CM | POA: Diagnosis not present

## 2018-04-29 DIAGNOSIS — M21612 Bunion of left foot: Secondary | ICD-10-CM | POA: Diagnosis not present

## 2018-08-25 DIAGNOSIS — H524 Presbyopia: Secondary | ICD-10-CM | POA: Diagnosis not present

## 2018-08-25 DIAGNOSIS — H52223 Regular astigmatism, bilateral: Secondary | ICD-10-CM | POA: Diagnosis not present

## 2018-08-25 DIAGNOSIS — Z961 Presence of intraocular lens: Secondary | ICD-10-CM | POA: Diagnosis not present

## 2018-08-25 DIAGNOSIS — H40013 Open angle with borderline findings, low risk, bilateral: Secondary | ICD-10-CM | POA: Diagnosis not present

## 2018-08-25 DIAGNOSIS — H59811 Chorioretinal scars after surgery for detachment, right eye: Secondary | ICD-10-CM | POA: Diagnosis not present

## 2018-08-25 DIAGNOSIS — H5213 Myopia, bilateral: Secondary | ICD-10-CM | POA: Diagnosis not present

## 2019-10-25 DIAGNOSIS — M21612 Bunion of left foot: Secondary | ICD-10-CM | POA: Diagnosis not present

## 2019-10-25 DIAGNOSIS — M79672 Pain in left foot: Secondary | ICD-10-CM | POA: Diagnosis not present

## 2019-10-25 DIAGNOSIS — T8484XA Pain due to internal orthopedic prosthetic devices, implants and grafts, initial encounter: Secondary | ICD-10-CM | POA: Diagnosis not present

## 2019-10-25 DIAGNOSIS — M2042 Other hammer toe(s) (acquired), left foot: Secondary | ICD-10-CM | POA: Diagnosis not present

## 2019-11-09 DIAGNOSIS — M2042 Other hammer toe(s) (acquired), left foot: Secondary | ICD-10-CM | POA: Diagnosis not present

## 2019-11-09 DIAGNOSIS — G4730 Sleep apnea, unspecified: Secondary | ICD-10-CM | POA: Diagnosis not present

## 2019-11-09 DIAGNOSIS — I679 Cerebrovascular disease, unspecified: Secondary | ICD-10-CM | POA: Diagnosis not present

## 2019-11-09 DIAGNOSIS — E291 Testicular hypofunction: Secondary | ICD-10-CM | POA: Diagnosis not present

## 2019-11-09 DIAGNOSIS — E7800 Pure hypercholesterolemia, unspecified: Secondary | ICD-10-CM | POA: Diagnosis not present

## 2019-11-09 DIAGNOSIS — Z125 Encounter for screening for malignant neoplasm of prostate: Secondary | ICD-10-CM | POA: Diagnosis not present

## 2019-11-09 DIAGNOSIS — Z23 Encounter for immunization: Secondary | ICD-10-CM | POA: Diagnosis not present

## 2019-11-23 DIAGNOSIS — M2042 Other hammer toe(s) (acquired), left foot: Secondary | ICD-10-CM | POA: Diagnosis not present

## 2019-11-23 DIAGNOSIS — M2022 Hallux rigidus, left foot: Secondary | ICD-10-CM | POA: Diagnosis not present

## 2019-11-23 DIAGNOSIS — M7742 Metatarsalgia, left foot: Secondary | ICD-10-CM | POA: Diagnosis not present

## 2019-11-23 DIAGNOSIS — M216X2 Other acquired deformities of left foot: Secondary | ICD-10-CM | POA: Diagnosis not present

## 2019-11-23 DIAGNOSIS — Z472 Encounter for removal of internal fixation device: Secondary | ICD-10-CM | POA: Diagnosis not present

## 2019-11-23 DIAGNOSIS — S93125A Dislocation of metatarsophalangeal joint of left lesser toe(s), initial encounter: Secondary | ICD-10-CM | POA: Diagnosis not present

## 2020-01-07 DIAGNOSIS — M21612 Bunion of left foot: Secondary | ICD-10-CM | POA: Diagnosis not present

## 2020-01-07 DIAGNOSIS — M79672 Pain in left foot: Secondary | ICD-10-CM | POA: Diagnosis not present

## 2020-01-07 DIAGNOSIS — M2042 Other hammer toe(s) (acquired), left foot: Secondary | ICD-10-CM | POA: Diagnosis not present

## 2020-01-07 DIAGNOSIS — Z4889 Encounter for other specified surgical aftercare: Secondary | ICD-10-CM | POA: Diagnosis not present

## 2020-01-19 DIAGNOSIS — Z803 Family history of malignant neoplasm of breast: Secondary | ICD-10-CM | POA: Diagnosis not present

## 2020-01-19 DIAGNOSIS — E669 Obesity, unspecified: Secondary | ICD-10-CM | POA: Diagnosis not present

## 2020-01-19 DIAGNOSIS — I739 Peripheral vascular disease, unspecified: Secondary | ICD-10-CM | POA: Diagnosis not present

## 2020-01-19 DIAGNOSIS — R030 Elevated blood-pressure reading, without diagnosis of hypertension: Secondary | ICD-10-CM | POA: Diagnosis not present

## 2020-01-19 DIAGNOSIS — Z7722 Contact with and (suspected) exposure to environmental tobacco smoke (acute) (chronic): Secondary | ICD-10-CM | POA: Diagnosis not present

## 2020-01-19 DIAGNOSIS — Z8249 Family history of ischemic heart disease and other diseases of the circulatory system: Secondary | ICD-10-CM | POA: Diagnosis not present

## 2020-01-19 DIAGNOSIS — Z801 Family history of malignant neoplasm of trachea, bronchus and lung: Secondary | ICD-10-CM | POA: Diagnosis not present

## 2020-01-19 DIAGNOSIS — G3184 Mild cognitive impairment, so stated: Secondary | ICD-10-CM | POA: Diagnosis not present

## 2020-01-19 DIAGNOSIS — Z809 Family history of malignant neoplasm, unspecified: Secondary | ICD-10-CM | POA: Diagnosis not present

## 2020-01-19 DIAGNOSIS — G4730 Sleep apnea, unspecified: Secondary | ICD-10-CM | POA: Diagnosis not present

## 2020-02-04 DIAGNOSIS — M2042 Other hammer toe(s) (acquired), left foot: Secondary | ICD-10-CM | POA: Diagnosis not present

## 2020-02-04 DIAGNOSIS — M21612 Bunion of left foot: Secondary | ICD-10-CM | POA: Diagnosis not present

## 2020-02-04 DIAGNOSIS — Z4889 Encounter for other specified surgical aftercare: Secondary | ICD-10-CM | POA: Diagnosis not present

## 2020-02-04 DIAGNOSIS — M79672 Pain in left foot: Secondary | ICD-10-CM | POA: Diagnosis not present

## 2020-02-21 DIAGNOSIS — M549 Dorsalgia, unspecified: Secondary | ICD-10-CM | POA: Diagnosis not present

## 2020-02-21 DIAGNOSIS — M25531 Pain in right wrist: Secondary | ICD-10-CM | POA: Diagnosis not present

## 2020-02-21 DIAGNOSIS — S01112A Laceration without foreign body of left eyelid and periocular area, initial encounter: Secondary | ICD-10-CM | POA: Diagnosis not present

## 2020-03-03 DIAGNOSIS — M21612 Bunion of left foot: Secondary | ICD-10-CM | POA: Diagnosis not present

## 2020-03-03 DIAGNOSIS — M79672 Pain in left foot: Secondary | ICD-10-CM | POA: Diagnosis not present

## 2020-03-03 DIAGNOSIS — M2042 Other hammer toe(s) (acquired), left foot: Secondary | ICD-10-CM | POA: Diagnosis not present

## 2020-05-15 DIAGNOSIS — E291 Testicular hypofunction: Secondary | ICD-10-CM | POA: Diagnosis not present

## 2020-05-15 DIAGNOSIS — E785 Hyperlipidemia, unspecified: Secondary | ICD-10-CM | POA: Diagnosis not present

## 2020-05-15 DIAGNOSIS — Z0000 Encounter for general adult medical examination without abnormal findings: Secondary | ICD-10-CM | POA: Diagnosis not present

## 2020-05-15 DIAGNOSIS — G4730 Sleep apnea, unspecified: Secondary | ICD-10-CM | POA: Diagnosis not present

## 2020-06-16 DIAGNOSIS — H40013 Open angle with borderline findings, low risk, bilateral: Secondary | ICD-10-CM | POA: Diagnosis not present

## 2020-08-03 DIAGNOSIS — R69 Illness, unspecified: Secondary | ICD-10-CM | POA: Diagnosis not present

## 2020-11-15 DIAGNOSIS — Z125 Encounter for screening for malignant neoplasm of prostate: Secondary | ICD-10-CM | POA: Diagnosis not present

## 2020-11-15 DIAGNOSIS — Z0000 Encounter for general adult medical examination without abnormal findings: Secondary | ICD-10-CM | POA: Diagnosis not present

## 2020-11-15 DIAGNOSIS — E7800 Pure hypercholesterolemia, unspecified: Secondary | ICD-10-CM | POA: Diagnosis not present

## 2020-11-15 DIAGNOSIS — Z1211 Encounter for screening for malignant neoplasm of colon: Secondary | ICD-10-CM | POA: Diagnosis not present

## 2021-01-27 DIAGNOSIS — G4730 Sleep apnea, unspecified: Secondary | ICD-10-CM | POA: Diagnosis not present

## 2021-01-27 DIAGNOSIS — E663 Overweight: Secondary | ICD-10-CM | POA: Diagnosis not present

## 2021-01-27 DIAGNOSIS — N529 Male erectile dysfunction, unspecified: Secondary | ICD-10-CM | POA: Diagnosis not present

## 2021-01-27 DIAGNOSIS — Z008 Encounter for other general examination: Secondary | ICD-10-CM | POA: Diagnosis not present

## 2021-01-27 DIAGNOSIS — Z801 Family history of malignant neoplasm of trachea, bronchus and lung: Secondary | ICD-10-CM | POA: Diagnosis not present

## 2021-01-27 DIAGNOSIS — Z87891 Personal history of nicotine dependence: Secondary | ICD-10-CM | POA: Diagnosis not present

## 2021-01-27 DIAGNOSIS — E785 Hyperlipidemia, unspecified: Secondary | ICD-10-CM | POA: Diagnosis not present

## 2021-01-27 DIAGNOSIS — Z86711 Personal history of pulmonary embolism: Secondary | ICD-10-CM | POA: Diagnosis not present

## 2021-03-29 DIAGNOSIS — D124 Benign neoplasm of descending colon: Secondary | ICD-10-CM | POA: Diagnosis not present

## 2021-03-29 DIAGNOSIS — Z1211 Encounter for screening for malignant neoplasm of colon: Secondary | ICD-10-CM | POA: Diagnosis not present

## 2021-03-29 DIAGNOSIS — D125 Benign neoplasm of sigmoid colon: Secondary | ICD-10-CM | POA: Diagnosis not present

## 2021-03-29 DIAGNOSIS — D123 Benign neoplasm of transverse colon: Secondary | ICD-10-CM | POA: Diagnosis not present

## 2021-03-29 DIAGNOSIS — K635 Polyp of colon: Secondary | ICD-10-CM | POA: Diagnosis not present

## 2021-03-29 DIAGNOSIS — K5730 Diverticulosis of large intestine without perforation or abscess without bleeding: Secondary | ICD-10-CM | POA: Diagnosis not present

## 2021-03-29 DIAGNOSIS — D122 Benign neoplasm of ascending colon: Secondary | ICD-10-CM | POA: Diagnosis not present

## 2021-04-04 DIAGNOSIS — D122 Benign neoplasm of ascending colon: Secondary | ICD-10-CM | POA: Diagnosis not present

## 2021-04-04 DIAGNOSIS — D123 Benign neoplasm of transverse colon: Secondary | ICD-10-CM | POA: Diagnosis not present

## 2021-04-04 DIAGNOSIS — D124 Benign neoplasm of descending colon: Secondary | ICD-10-CM | POA: Diagnosis not present

## 2021-04-04 DIAGNOSIS — K635 Polyp of colon: Secondary | ICD-10-CM | POA: Diagnosis not present

## 2021-04-04 DIAGNOSIS — D125 Benign neoplasm of sigmoid colon: Secondary | ICD-10-CM | POA: Diagnosis not present

## 2021-04-09 DIAGNOSIS — Z0100 Encounter for examination of eyes and vision without abnormal findings: Secondary | ICD-10-CM | POA: Diagnosis not present

## 2021-04-10 DIAGNOSIS — M7021 Olecranon bursitis, right elbow: Secondary | ICD-10-CM | POA: Diagnosis not present

## 2021-04-23 DIAGNOSIS — M25421 Effusion, right elbow: Secondary | ICD-10-CM | POA: Diagnosis not present

## 2021-04-23 DIAGNOSIS — Z6830 Body mass index (BMI) 30.0-30.9, adult: Secondary | ICD-10-CM | POA: Diagnosis not present

## 2021-05-07 DIAGNOSIS — Z6830 Body mass index (BMI) 30.0-30.9, adult: Secondary | ICD-10-CM | POA: Diagnosis not present

## 2021-05-07 DIAGNOSIS — M11821 Other specified crystal arthropathies, right elbow: Secondary | ICD-10-CM | POA: Diagnosis not present

## 2021-05-29 DIAGNOSIS — Z6830 Body mass index (BMI) 30.0-30.9, adult: Secondary | ICD-10-CM | POA: Diagnosis not present

## 2021-05-29 DIAGNOSIS — M5410 Radiculopathy, site unspecified: Secondary | ICD-10-CM | POA: Diagnosis not present

## 2021-05-29 DIAGNOSIS — M25551 Pain in right hip: Secondary | ICD-10-CM | POA: Diagnosis not present

## 2021-05-29 DIAGNOSIS — M62830 Muscle spasm of back: Secondary | ICD-10-CM | POA: Diagnosis not present

## 2021-05-31 ENCOUNTER — Other Ambulatory Visit: Payer: Self-pay

## 2021-05-31 ENCOUNTER — Emergency Department (HOSPITAL_COMMUNITY)
Admission: EM | Admit: 2021-05-31 | Discharge: 2021-05-31 | Disposition: A | Discharge status: Home / Self Care | Payer: Medicare HMO | Attending: Emergency Medicine | Admitting: Emergency Medicine

## 2021-05-31 DIAGNOSIS — M5431 Sciatica, right side: Secondary | ICD-10-CM | POA: Diagnosis not present

## 2021-05-31 DIAGNOSIS — Z7982 Long term (current) use of aspirin: Secondary | ICD-10-CM | POA: Diagnosis not present

## 2021-05-31 DIAGNOSIS — Z87891 Personal history of nicotine dependence: Secondary | ICD-10-CM | POA: Diagnosis not present

## 2021-05-31 DIAGNOSIS — M545 Low back pain, unspecified: Secondary | ICD-10-CM | POA: Diagnosis present

## 2021-05-31 DIAGNOSIS — M5441 Lumbago with sciatica, right side: Secondary | ICD-10-CM | POA: Diagnosis not present

## 2021-05-31 MED ORDER — IBUPROFEN 400 MG PO TABS
400.0000 mg | ORAL_TABLET | Freq: Once | ORAL | Status: AC
  Administered 2021-05-31: 400 mg via ORAL
  Filled 2021-05-31: qty 1

## 2021-05-31 MED ORDER — GABAPENTIN 100 MG PO CAPS
100.0000 mg | ORAL_CAPSULE | Freq: Once | ORAL | Status: AC
  Administered 2021-05-31: 100 mg via ORAL
  Filled 2021-05-31: qty 1

## 2021-05-31 MED ORDER — CYCLOBENZAPRINE HCL 10 MG PO TABS: 10.0000 mg | ORAL_TABLET | Freq: Every day | ORAL | 0 refills | Status: AC

## 2021-05-31 MED ORDER — GABAPENTIN 100 MG PO CAPS: 100.0000 mg | ORAL_CAPSULE | Freq: Three times a day (TID) | ORAL | 0 refills | Status: AC

## 2021-05-31 MED ORDER — ACETAMINOPHEN 500 MG PO TABS
1000.0000 mg | ORAL_TABLET | Freq: Once | ORAL | Status: AC
  Administered 2021-05-31: 1000 mg via ORAL
  Filled 2021-05-31: qty 2

## 2021-05-31 MED ORDER — LIDOCAINE 5 % EX PTCH
1.0000 | MEDICATED_PATCH | CUTANEOUS | Status: DC
Start: 2021-05-31 — End: 2021-05-31
  Administered 2021-05-31: 1 via TRANSDERMAL
  Filled 2021-05-31: qty 1

## 2021-05-31 MED ORDER — OXYCODONE HCL 5 MG PO TABS
5.0000 mg | ORAL_TABLET | Freq: Once | ORAL | Status: AC
  Administered 2021-05-31: 5 mg via ORAL
  Filled 2021-05-31: qty 1

## 2021-05-31 NOTE — ED Provider Notes (Signed)
Emergency Medicine Provider Triage Evaluation Note  Odarius Bauermeister , a 70 y.o. male  was evaluated in triage.  Pt complains of R hip pain.  Review of Systems  Positive: R hip pain, R leg pain Negative: Numbness, weakness, fever, bowel/bladder incontinence or saddle anesthesia  Physical Exam  BP 129/74 (BP Location: Left Arm)   Pulse 73   Temp 98.1 F (36.7 C) (Oral)   Resp 20   SpO2 98%  Gen:   Awake, no distress   Resp:  Normal effort  MSK:   Moves extremities without difficulty  Other:  TTP R hip, lower back, positive SLR  Medical Decision Making  Medically screening exam initiated at 4:01 PM.  Appropriate orders placed.  Akxel Thiam was informed that the remainder of the evaluation will be completed by another provider, this initial triage assessment does not replace that evaluation, and the importance of remaining in the ED until their evaluation is complete.  Radicular pain for several weeks, not improve despite medication perscribed by his PCP several days prior.  No red flags.  No injury   Domenic Moras, PA-C 05/31/21 1604    Sherwood Gambler, MD 06/01/21 [phone]

## 2021-05-31 NOTE — Discharge Instructions (Addendum)
Sciatic pain can be very difficult to control.  I would recommend continuing to take ibuprofen and Tylenol at home for pain.  You can also obtain lidocaine patches over-the-counter at [facility] or [facility] if you find any help you.  I have also prescribed a muscle relaxer.  Although this is not often help back pain, it can certainly help people with back pain and sleep at night.  Do not take it during the day or with alcohol, it can make you very drowsy.  Gabapentin is good for neuropathic pain which is the kind often felt in sciatica.  You have to start at a very low dose, only 100 mg 3 times a day, because it can make people lightheaded and prone to falling but your primary care doctor can increase this dose of gabapentin very quickly once you are able to tolerate the 100 mg without any side effects.  Please follow with your primary care doctor.  If your pain is still present in 3 to 4 weeks, you will likely need an MRI to see a spinal surgeon.  I would talk to her primary care doctor about this as well.

## 2021-05-31 NOTE — ED Triage Notes (Signed)
Pt c/o R hip/ lower back pain x "a couple weeks, worse this week." States sometimes it feels like R leg is going numb. 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   10/10, nerve pain, tingling, shooting.

## 2021-05-31 NOTE — ED Provider Notes (Signed)
[facility] EMERGENCY DEPARTMENT Provider Note   CSN: [id number] Arrival date & time: 05/31/21  1421   History Chief Complaint  Patient presents with   Hip Pain    R    Bradley Newton is a 70 y.o. male.   Hip Pain Pertinent negatives include no chest pain, no abdominal pain and no shortness of breath.  70 year old male with a past medical history of hyperlipidemia, previous stroke, previous back surgery 1980 presenting to the emergency department with lower back pain radiating down his right hip.  Patient reports that he was playing golf and doing some yard work around 5 days ago.  That evening, he developed right sided and middle back pain.  It radiates down his buttock to the back of his right leg and down to his lower leg.  It is worsened if he tries to lay flat.  It has been progressively worsening this week.  He states that he has had to sleep sitting upright in a chair due to the pain.  He has been taking ibuprofen and Tylenol intermittently over the past week without significant improvement.  He denies any recent trauma.  He denies any recent falls.  He denies any weakness, saddle anesthesia, bowel or bladder incontinence or retention.  He states his symptoms feel similar to when he had his initial back surgery in 1980.  He denies any IV drug use.  He denies any history of diabetes.  He denies any fever.  Past Medical History:  Diagnosis Date   HLD (hyperlipidemia)    Stroke [facility])     Patient Active Problem List   Diagnosis Date Noted   UNSPECIFIED PERIPHERAL VASCULAR DISEASE 08/09/2010   PATENT FORAMEN OVALE 06/07/2010    Past Surgical History:  Procedure Laterality Date   BACK SURGERY  1979   SHOULDER SURGERY  oct 2010       Family History  Problem Relation Age of Onset   Heart attack Father 57    Social History   Tobacco Use   Smoking status: Former   Tobacco comments:    quit 1981  Substance Use Topics   Alcohol use: No   Drug use: No     Home Medications Prior to Admission medications   Medication Sig Start Date End Date Taking? Authorizing Provider  cyclobenzaprine (FLEXERIL) 10 MG tablet Take 1 tablet (10 mg total) by mouth at bedtime. 05/31/21  Yes Claud Kelp, MD  gabapentin (NEURONTIN) 100 MG capsule Take 1 capsule (100 mg total) by mouth 3 (three) times daily. 05/31/21 06/30/21 Yes Claud Kelp, MD  allopurinol (ZYLOPRIM) 100 MG tablet Take 1 tablet by mouth Daily. 05/28/11   [provider]  ANDROGEL PUMP 20.25 MG/ACT (1.62%) GEL Place 2 application onto the skin Daily. 04/11/11   [provider]  aspirin 325 MG tablet Take 325 mg by mouth daily.      [provider]  atorvastatin (LIPITOR) 20 MG tablet Take 1 tablet by mouth Daily. 05/01/11   [provider]  clonazePAM (KLONOPIN) 1 MG tablet Take 1 tablet by mouth as needed. 05/09/11   [provider]  COLCRYS 0.6 MG tablet Take 1 tablet by mouth Daily. 05/28/11   [provider]  fish oil-omega-3 fatty acids 1000 MG capsule Take 1 g by mouth daily.      [provider]  metoprolol tartrate (LOPRESSOR) 25 MG tablet Take 1 tablet (25 mg total) by mouth 2 (two) times daily. 04/11/11 04/10/12  Allred, Jeneen Rinks,  MD  multivitamin (THERAGRAN) per tablet Take 1 tablet by mouth daily.      [provider]    Allergies    Naproxen  Review of Systems   Review of Systems  Constitutional:  Negative for chills and fever.  HENT:  Negative for ear pain and sore throat.   Eyes:  Negative for pain and visual disturbance.  Respiratory:  Negative for cough and shortness of breath.   Cardiovascular:  Negative for chest pain and palpitations.  Gastrointestinal:  Negative for abdominal pain and vomiting.  Genitourinary:  Negative for dysuria and hematuria.  Musculoskeletal:  Positive for back pain. Negative for arthralgias.  Skin:  Negative for color change and rash.  Neurological:  Negative for seizures and syncope.   All other systems reviewed and are negative.  Physical Exam Updated Vital Signs BP 129/74 (BP Location: Left Arm)   Pulse 73   Temp 98.1 F (36.7 C) (Oral)   Resp 20   SpO2 98%   Physical Exam Vitals and nursing note reviewed.  Constitutional:      Appearance: He is well-developed.  HENT:     Head: Normocephalic and atraumatic.  Eyes:     Conjunctiva/sclera: Conjunctivae normal.  Cardiovascular:     Rate and Rhythm: Normal rate and regular rhythm.     Heart sounds: No murmur heard. Pulmonary:     Effort: Pulmonary effort is normal. No respiratory distress.     Breath sounds: Normal breath sounds.  Abdominal:     Palpations: Abdomen is soft.     Tenderness: There is no abdominal tenderness.  Musculoskeletal:        General: Tenderness present.     Cervical back: Neck supple.     Comments: TTP over the right midline lumbar spine and right paraspinal musculature of the same level.  With the patient lying flat, raising the left leg straight with flexion at the hip reproduces the symptoms in the right leg.  Skin:    General: Skin is warm and dry.     Capillary Refill: Capillary refill takes less than 2 seconds.  Neurological:     Mental Status: He is alert and oriented to person, place, and time.     Comments: Normal plantar dorsiflexion strength bilaterally.  Range of motion of the right hip limited due to pain with hip flexion, but he appears to have full strength in the hip.    ED Results / Procedures / Treatments   Labs (all labs ordered are listed, but only abnormal results are displayed) Labs Reviewed - No data to display  EKG None  Radiology No results found.  Procedures Procedures   Medications Ordered in ED Medications  gabapentin (NEURONTIN) capsule 100 mg (has no administration in time range)  oxyCODONE (Oxy IR/ROXICODONE) immediate release tablet 5 mg (has no administration in time range)  ibuprofen (ADVIL) tablet 400 mg (has no administration in  time range)  acetaminophen (TYLENOL) tablet 1,000 mg (has no administration in time range)  lidocaine (LIDODERM) 5 % 1 patch (has no administration in time range)    ED Course  I have reviewed the triage vital signs and the nursing notes.  Pertinent labs & imaging results that were available during my care of the patient were reviewed by me and considered in my medical decision making (see chart for details).    MDM Rules/Calculators/A&P  70 year old male with a history of sciatica presenting with lower back pain radiating down the right leg.  Vital signs reviewed, within acceptable limits.  Physical exam is notable for positive crossed straight leg raise.  Differential includes herniated disc, inflammation, lumbosacral strain.  The patient has no IV drug use, diabetes, or infectious symptoms that would suggest an epidural abscess.  Patient has no abdominal tenderness or palpable mass that would suggest AAA.  Patient has no red flag symptoms such as bowel or bladder incontinence, retention, saddle anesthesia, or weakness that would be concerning for cord involvement.  No trauma to suggest a fracture, I do not believe that imaging is indicated.  I do not believe that blood work is indicated.  Will provide analgesia here in the emergency department.  I believe that the patient's recent golfing and yard work resulted in overuse, possibly a herniated disc or just irritated and already existing pathology.  We will attempt conservative care measures first.  Will prescribe a short course of gabapentin and Flexeril.  Will encourage over-the-counter NSAID use.  I discussed the patient the natural history of sciatic pain and the need for close follow-up.  Patient expresses understanding and agreement.  Strict return precautions discussed.  Final Clinical Impression(s) / ED Diagnoses Final diagnoses:  Sciatica of right side   Rx / DC Orders ED Discharge Orders          Ordered     gabapentin (NEURONTIN) 100 MG capsule  3 times daily        05/31/21 1716    cyclobenzaprine (FLEXERIL) 10 MG tablet  Daily at bedtime        05/31/21 1716             Claud Kelp, MD 05/31/21 Kevan Ny    Lajean Saver, MD 05/31/21 [phone]

## 2021-09-27 DIAGNOSIS — H40013 Open angle with borderline findings, low risk, bilateral: Secondary | ICD-10-CM | POA: Diagnosis not present

## 2021-11-02 DIAGNOSIS — Z0100 Encounter for examination of eyes and vision without abnormal findings: Secondary | ICD-10-CM | POA: Diagnosis not present

## 2022-01-15 DIAGNOSIS — M542 Cervicalgia: Secondary | ICD-10-CM | POA: Diagnosis not present

## 2022-03-15 DIAGNOSIS — E291 Testicular hypofunction: Secondary | ICD-10-CM | POA: Diagnosis not present

## 2022-03-15 DIAGNOSIS — G4730 Sleep apnea, unspecified: Secondary | ICD-10-CM | POA: Diagnosis not present

## 2022-03-15 DIAGNOSIS — Z125 Encounter for screening for malignant neoplasm of prostate: Secondary | ICD-10-CM | POA: Diagnosis not present

## 2022-03-15 DIAGNOSIS — E7800 Pure hypercholesterolemia, unspecified: Secondary | ICD-10-CM | POA: Diagnosis not present

## 2022-03-20 DIAGNOSIS — H40013 Open angle with borderline findings, low risk, bilateral: Secondary | ICD-10-CM | POA: Diagnosis not present

## 2022-08-02 DIAGNOSIS — N529 Male erectile dysfunction, unspecified: Secondary | ICD-10-CM | POA: Diagnosis not present

## 2022-08-02 DIAGNOSIS — Z8249 Family history of ischemic heart disease and other diseases of the circulatory system: Secondary | ICD-10-CM | POA: Diagnosis not present

## 2022-08-02 DIAGNOSIS — Z803 Family history of malignant neoplasm of breast: Secondary | ICD-10-CM | POA: Diagnosis not present

## 2022-08-02 DIAGNOSIS — Z8673 Personal history of transient ischemic attack (TIA), and cerebral infarction without residual deficits: Secondary | ICD-10-CM | POA: Diagnosis not present

## 2022-08-02 DIAGNOSIS — G4730 Sleep apnea, unspecified: Secondary | ICD-10-CM | POA: Diagnosis not present

## 2022-08-02 DIAGNOSIS — Z87891 Personal history of nicotine dependence: Secondary | ICD-10-CM | POA: Diagnosis not present

## 2022-08-02 DIAGNOSIS — M1990 Unspecified osteoarthritis, unspecified site: Secondary | ICD-10-CM | POA: Diagnosis not present

## 2022-09-09 DIAGNOSIS — H40013 Open angle with borderline findings, low risk, bilateral: Secondary | ICD-10-CM | POA: Diagnosis not present

## 2022-10-11 DIAGNOSIS — Z0100 Encounter for examination of eyes and vision without abnormal findings: Secondary | ICD-10-CM | POA: Diagnosis not present

## 2023-03-10 DIAGNOSIS — H40013 Open angle with borderline findings, low risk, bilateral: Secondary | ICD-10-CM | POA: Diagnosis not present

## 2023-03-20 DIAGNOSIS — Z0000 Encounter for general adult medical examination without abnormal findings: Secondary | ICD-10-CM | POA: Diagnosis not present

## 2023-03-20 DIAGNOSIS — Z125 Encounter for screening for malignant neoplasm of prostate: Secondary | ICD-10-CM | POA: Diagnosis not present

## 2023-03-20 DIAGNOSIS — E7800 Pure hypercholesterolemia, unspecified: Secondary | ICD-10-CM | POA: Diagnosis not present

## 2023-08-21 DIAGNOSIS — M25552 Pain in left hip: Secondary | ICD-10-CM | POA: Diagnosis not present

## 2023-09-08 DIAGNOSIS — H40013 Open angle with borderline findings, low risk, bilateral: Secondary | ICD-10-CM | POA: Diagnosis not present

## 2024-01-27 DIAGNOSIS — Z0100 Encounter for examination of eyes and vision without abnormal findings: Secondary | ICD-10-CM | POA: Diagnosis not present

## 2024-03-16 DIAGNOSIS — H40013 Open angle with borderline findings, low risk, bilateral: Secondary | ICD-10-CM | POA: Diagnosis not present

## 2024-03-25 DIAGNOSIS — Z125 Encounter for screening for malignant neoplasm of prostate: Secondary | ICD-10-CM | POA: Diagnosis not present

## 2024-03-25 DIAGNOSIS — E7800 Pure hypercholesterolemia, unspecified: Secondary | ICD-10-CM | POA: Diagnosis not present

## 2024-09-15 DIAGNOSIS — H40013 Open angle with borderline findings, low risk, bilateral: Secondary | ICD-10-CM | POA: Diagnosis not present
